# Patient Record
Sex: Male | Born: 1960 | Race: White | Hispanic: No | Marital: Single | Smoking: Current every day smoker
Health system: Southern US, Community
[De-identification: ages and names within clinical notes are randomized; demographics above are authoritative.]

## PROBLEM LIST (undated history)

## (undated) DIAGNOSIS — Z87442 Personal history of urinary calculi: Secondary | ICD-10-CM

## (undated) DIAGNOSIS — F32A Depression, unspecified: Secondary | ICD-10-CM

## (undated) DIAGNOSIS — F419 Anxiety disorder, unspecified: Secondary | ICD-10-CM

## (undated) DIAGNOSIS — K219 Gastro-esophageal reflux disease without esophagitis: Secondary | ICD-10-CM

## (undated) DIAGNOSIS — F329 Major depressive disorder, single episode, unspecified: Secondary | ICD-10-CM

## (undated) HISTORY — PX: LITHOTRIPSY: SUR834

## (undated) HISTORY — PX: HIP SURGERY: SHX245

---

## 2007-08-17 ENCOUNTER — Encounter: Admission: RE | Admit: 2007-08-17 | Discharge: 2007-08-17 | Payer: Self-pay | Admitting: Internal Medicine

## 2011-07-23 ENCOUNTER — Other Ambulatory Visit: Payer: Self-pay | Admitting: Internal Medicine

## 2011-07-23 DIAGNOSIS — R911 Solitary pulmonary nodule: Secondary | ICD-10-CM

## 2011-07-27 ENCOUNTER — Ambulatory Visit
Admission: RE | Admit: 2011-07-27 | Discharge: 2011-07-27 | Disposition: A | Discharge status: Home / Self Care | Payer: BC Managed Care – PPO | Source: Ambulatory Visit | Attending: Internal Medicine | Admitting: Internal Medicine

## 2011-07-27 DIAGNOSIS — R911 Solitary pulmonary nodule: Secondary | ICD-10-CM

## 2011-07-27 MED ORDER — IOHEXOL 300 MG/ML  SOLN
75.0000 mL | Freq: Once | INTRAMUSCULAR | Status: AC | PRN
  Administered 2011-07-27: 75 mL via INTRAVENOUS

## 2011-08-04 ENCOUNTER — Other Ambulatory Visit: Payer: Self-pay | Admitting: Internal Medicine

## 2011-08-04 DIAGNOSIS — R911 Solitary pulmonary nodule: Secondary | ICD-10-CM

## 2011-11-27 ENCOUNTER — Other Ambulatory Visit: Payer: BC Managed Care – PPO

## 2014-06-06 ENCOUNTER — Emergency Department (HOSPITAL_COMMUNITY)
Admission: EM | Admit: 2014-06-06 | Discharge: 2014-06-06 | Disposition: A | Discharge status: Home / Self Care | Payer: BLUE CROSS/BLUE SHIELD | Attending: Emergency Medicine | Admitting: Emergency Medicine

## 2014-06-06 ENCOUNTER — Emergency Department (HOSPITAL_COMMUNITY): Payer: BLUE CROSS/BLUE SHIELD

## 2014-06-06 ENCOUNTER — Encounter (HOSPITAL_COMMUNITY): Payer: Self-pay | Admitting: Physical Medicine and Rehabilitation

## 2014-06-06 DIAGNOSIS — N200 Calculus of kidney: Secondary | ICD-10-CM | POA: Diagnosis not present

## 2014-06-06 DIAGNOSIS — R0789 Other chest pain: Secondary | ICD-10-CM | POA: Diagnosis not present

## 2014-06-06 DIAGNOSIS — Z79899 Other long term (current) drug therapy: Secondary | ICD-10-CM | POA: Insufficient documentation

## 2014-06-06 DIAGNOSIS — K2970 Gastritis, unspecified, without bleeding: Secondary | ICD-10-CM | POA: Diagnosis not present

## 2014-06-06 DIAGNOSIS — R1010 Upper abdominal pain, unspecified: Secondary | ICD-10-CM | POA: Diagnosis not present

## 2014-06-06 DIAGNOSIS — R079 Chest pain, unspecified: Secondary | ICD-10-CM | POA: Diagnosis present

## 2014-06-06 DIAGNOSIS — Z720 Tobacco use: Secondary | ICD-10-CM | POA: Insufficient documentation

## 2014-06-06 LAB — COMPREHENSIVE METABOLIC PANEL
ALT: 11 U/L (ref 0–53)
AST: 17 U/L (ref 0–37)
Albumin: 3.8 g/dL (ref 3.5–5.2)
Alkaline Phosphatase: 113 U/L (ref 39–117)
Anion gap: 10 (ref 5–15)
BUN: 18 mg/dL (ref 6–23)
CO2: 24 mmol/L (ref 19–32)
Calcium: 9.1 mg/dL (ref 8.4–10.5)
Chloride: 106 mEq/L (ref 96–112)
Creatinine, Ser: 1.29 mg/dL (ref 0.50–1.35)
GFR calc Af Amer: 71 mL/min — ABNORMAL LOW (ref >90)
GFR calc non Af Amer: 61 mL/min — ABNORMAL LOW (ref >90)
Glucose, Bld: 103 mg/dL — ABNORMAL HIGH (ref 70–99)
Potassium: 3.7 mmol/L (ref 3.5–5.1)
Sodium: 140 mmol/L (ref 135–145)
Total Bilirubin: 0.6 mg/dL (ref 0.3–1.2)
Total Protein: 6.3 g/dL (ref 6.0–8.3)

## 2014-06-06 LAB — CBC WITH DIFFERENTIAL/PLATELET
Basophils Absolute: 0 10*3/uL (ref 0.0–0.1)
Basophils Relative: 0 % (ref 0–1)
Eosinophils Absolute: 0.1 10*3/uL (ref 0.0–0.7)
Eosinophils Relative: 1 % (ref 0–5)
HCT: 45.8 % (ref 39.0–52.0)
Hemoglobin: 15.8 g/dL (ref 13.0–17.0)
Lymphocytes Relative: 23 % (ref 12–46)
Lymphs Abs: 2.1 10*3/uL (ref 0.7–4.0)
MCH: 30.2 pg (ref 26.0–34.0)
MCHC: 34.5 g/dL (ref 30.0–36.0)
MCV: 87.4 fL (ref 78.0–100.0)
Monocytes Absolute: 0.8 10*3/uL (ref 0.1–1.0)
Monocytes Relative: 9 % (ref 3–12)
Neutro Abs: 6 10*3/uL (ref 1.7–7.7)
Neutrophils Relative %: 67 % (ref 43–77)
Platelets: 241 10*3/uL (ref 150–400)
RBC: 5.24 MIL/uL (ref 4.22–5.81)
RDW: 13.9 % (ref 11.5–15.5)
WBC: 9.1 10*3/uL (ref 4.0–10.5)

## 2014-06-06 LAB — I-STAT TROPONIN, ED: Troponin i, poc: 0 ng/mL (ref 0.00–0.08)

## 2014-06-06 LAB — LIPASE, BLOOD: Lipase: 30 U/L (ref 11–59)

## 2014-06-06 MED ORDER — OXYCODONE-ACETAMINOPHEN 5-325 MG PO TABS: 2.0000 | ORAL_TABLET | ORAL | Status: DC | PRN

## 2014-06-06 MED ORDER — TAMSULOSIN HCL 0.4 MG PO CAPS: 0.4000 mg | ORAL_CAPSULE | Freq: Two times a day (BID) | ORAL | Status: AC

## 2014-06-06 MED ORDER — PANTOPRAZOLE SODIUM 20 MG PO TBEC: 20.0000 mg | DELAYED_RELEASE_TABLET | Freq: Every day | ORAL | Status: AC

## 2014-06-06 MED ORDER — IOHEXOL 300 MG/ML  SOLN
100.0000 mL | Freq: Once | INTRAMUSCULAR | Status: AC | PRN
  Administered 2014-06-06: 100 mL via INTRAVENOUS

## 2014-06-06 MED ORDER — MORPHINE SULFATE 4 MG/ML IJ SOLN
4.0000 mg | Freq: Once | INTRAMUSCULAR | Status: AC
  Administered 2014-06-06: 4 mg via INTRAVENOUS
  Filled 2014-06-06: qty 1

## 2014-06-06 MED ORDER — FENTANYL CITRATE 0.05 MG/ML IJ SOLN
50.0000 ug | Freq: Once | INTRAMUSCULAR | Status: AC
  Administered 2014-06-06: 50 ug via INTRAVENOUS
  Filled 2014-06-06: qty 2

## 2014-06-06 MED ORDER — IOHEXOL 300 MG/ML  SOLN: 25.0000 mL | INTRAMUSCULAR | Status: DC

## 2014-06-06 NOTE — ED Notes (Signed)
Pt presents to department for evaluation of diffuse chest pain, ongoing x4 days, 9/10 pain upon arrival to ED, increases with movement. Pt is alert and oriented x4.

## 2014-06-06 NOTE — ED Notes (Signed)
Patient transported to CT 

## 2014-06-06 NOTE — Discharge Instructions (Signed)
Chest Pain (Nonspecific) °It is often hard to give a specific diagnosis for the cause of chest pain. There is always a chance that your pain could be related to something serious, such as a heart attack or a blood clot in the lungs. You need to follow up with your health care provider for further evaluation. °CAUSES  °· Heartburn. °· Pneumonia or bronchitis. °· Anxiety or stress. °· Inflammation around your heart (pericarditis) or lung (pleuritis or pleurisy). °· A blood clot in the lung. °· A collapsed lung (pneumothorax). It can develop suddenly on its own (spontaneous pneumothorax) or from trauma to the chest. °· Shingles infection (herpes zoster virus). °The chest wall is composed of bones, muscles, and cartilage. Any of these can be the source of the pain. °· The bones can be bruised by injury. °· The muscles or cartilage can be strained by coughing or overwork. °· The cartilage can be affected by inflammation and become sore (costochondritis). °DIAGNOSIS  °Lab tests or other studies may be needed to find the cause of your pain. Your health care provider may have you take a test called an ambulatory electrocardiogram (ECG). An ECG records your heartbeat patterns over a 24-hour period. You may also have other tests, such as: °· Transthoracic echocardiogram (TTE). During echocardiography, sound waves are used to evaluate how blood flows through your heart. °· Transesophageal echocardiogram (TEE). °· Cardiac monitoring. This allows your health care provider to monitor your heart rate and rhythm in real time. °· Holter monitor. This is a portable device that records your heartbeat and can help diagnose heart arrhythmias. It allows your health care provider to track your heart activity for several days, if needed. °· Stress tests by exercise or by giving medicine that makes the heart beat faster. °TREATMENT  °· Treatment depends on what may be causing your chest pain. Treatment may include: °¨ Acid blockers for  heartburn. °¨ Anti-inflammatory medicine. °¨ Pain medicine for inflammatory conditions. °¨ Antibiotics if an infection is present. °· You may be advised to change lifestyle habits. This includes stopping smoking and avoiding alcohol, caffeine, and chocolate. °· You may be advised to keep your head raised (elevated) when sleeping. This reduces the chance of acid going backward from your stomach into your esophagus. °Most of the time, nonspecific chest pain will improve within 2-3 days with rest and mild pain medicine.  °HOME CARE INSTRUCTIONS  °· If antibiotics were prescribed, take them as directed. Finish them even if you start to feel better. °· For the next few days, avoid physical activities that bring on chest pain. Continue physical activities as directed. °· Do not use any tobacco products, including cigarettes, chewing tobacco, or electronic cigarettes. °· Avoid drinking alcohol. °· Only take medicine as directed by your health care provider. °· Follow your health care provider's suggestions for further testing if your chest pain does not go away. °· Keep any follow-up appointments you made. If you do not go to an appointment, you could develop lasting (chronic) problems with pain. If there is any problem keeping an appointment, call to reschedule. °SEEK MEDICAL CARE IF:  °· Your chest pain does not go away, even after treatment. °· You have a rash with blisters on your chest. °· You have a fever. °SEEK IMMEDIATE MEDICAL CARE IF:  °· You have increased chest pain or pain that spreads to your arm, neck, jaw, back, or abdomen. °· You have shortness of breath. °· You have an increasing cough, or you cough   up blood.  You have severe back or abdominal pain.  You feel nauseous or vomit.  You have severe weakness.  You faint.  You have chills. This is an emergency. Do not wait to see if the pain will go away. Get medical help at once. Call your local emergency services (911 in [place] Do not drive  yourself to the hospital. MAKE SURE YOU:   Understand these instructions.  Will watch your condition.  Will get help right away if you are not doing well or get worse. Document Released: 02/11/2005 Document Revised: 05/09/2013 Document Reviewed: 12/08/2007 [facility] Patient Information 2015 [employer], [place]. This information is not intended to replace advice given to you by your health care provider. Make sure you discuss any questions you have with your health care provider.   You were evaluated in the ED today for your chest pain. There does not appear to be an emergent cause for your symptoms at this time. You were found to have a kidney stone and stomach inflammation. Please take your medications as prescribed. Please follow-up with your primary care and urologist within the next 48 hours or definitive care and management of your symptoms. Return to ED for worsening symptoms.

## 2014-06-06 NOTE — ED Notes (Signed)
Pt returned from CT °

## 2014-06-06 NOTE — ED Provider Notes (Signed)
CSN: [id number]     Arrival date & time 06/06/14  1401 History   First MD Initiated Contact with Patient 06/06/14 1504     Chief Complaint  Patient presents with  . Chest Pain     (Consider location/radiation/quality/duration/timing/severity/associated sxs/prior Treatment) HPI Dillon Gross is a 54 y.o. male who comes in for evaluation of a 4 day history of sharp, intermittent chest pain. He currently rates his pain as an 8/10. The discomfort has been intermittent, lasting minutes at a time. The pain is exacerbated with movement, better with rest and is non-positional. Pain does not radiate. He reports for the previous 2 weeks he has had a "really bad cold" and is not sure whether to attribute his fevers, mild headache, nausea to the cold or his chest discomfort. He also reports regular exercise and does not experience discomfort when he exercises. Denies changes in vision, shortness of breath, cough, hemoptysis, abdominal pain, vomiting, diarrhea or constipation, numbness or weakness, recent travel or surgery. Current 1 pack per day smoker. Denies family history of cardiac events.  History reviewed. No pertinent past medical history. History reviewed. No pertinent past surgical history. No family history on file. History  Substance Use Topics  . Smoking status: Current Every Day Smoker  . Smokeless tobacco: Not on file  . Alcohol Use: No    Review of Systems  All other systems reviewed and are negative. A 10 point review of systems was completed and was negative except for pertinent positives and negatives as mentioned in the history of present illness      Allergies  Review of patient's allergies indicates no known allergies.  Home Medications   Prior to Admission medications   Medication Sig Start Date End Date Taking? Authorizing Provider  aspirin-acetaminophen-caffeine (EXCEDRIN MIGRAINE) [phone] MG per tablet Take 1 tablet by mouth every 6 (six) hours as needed for  headache (pain).   Yes Historical Provider, MD  sertraline (ZOLOFT) 100 MG tablet Take 200 mg by mouth daily. 05/08/14  Yes Historical Provider, MD   BP 148/97 mmHg  Pulse 70  Temp(Src) 97.3 F (36.3 C) (Oral)  Resp 16  SpO2 96% Physical Exam  Constitutional: He is oriented to person, place, and time. He appears well-developed and well-nourished.  HENT:  Head: Normocephalic and atraumatic.  Mouth/Throat: Oropharynx is clear and moist.  Eyes: Conjunctivae are normal. Pupils are equal, round, and reactive to light. Right eye exhibits no discharge. Left eye exhibits no discharge. No scleral icterus.  Neck: Normal range of motion. Neck supple.  Cardiovascular: Normal rate, regular rhythm, normal heart sounds and intact distal pulses.   Pulmonary/Chest: Effort normal and breath sounds normal. No respiratory distress. He has no wheezes. He has no rales.  Sharp, stabbing pain patient experienced is reproduced upon palpation of right and left subcostal space. Tenderness diffusely to left and right intercostal spaces.  Abdominal: Soft. There is no tenderness.  Mild tenderness to palpation over upper right and left rectus abdominis muscles. No epigastric tenderness. No pulsatile masses.  Abdomen otherwise soft, nontender without rebound or guarding. No overt lesions or deformities noted.  Genitourinary:  Right inguinal hernia.  Musculoskeletal: Normal range of motion. He exhibits no edema or tenderness.  Neurological: He is alert and oriented to person, place, and time.  Cranial Nerves II-XII grossly intact  Skin: Skin is warm and dry. No rash noted.  Psychiatric: He has a normal mood and affect.  Nursing note and vitals reviewed.   ED Course  Procedures (  including critical care time) Labs Review Labs Reviewed  COMPREHENSIVE METABOLIC PANEL - Abnormal; Notable for the following:    Glucose, Bld 103 (*)    GFR calc non Af Amer 61 (*)    GFR calc Af Amer 71 (*)    All other components  within normal limits  CBC WITH DIFFERENTIAL  Rosezena Sensor, ED    Imaging Review Dg Chest 2 View  06/06/2014   CLINICAL DATA:  Chest pain for 4 days  EXAM: CHEST  2 VIEW  COMPARISON:  07/27/2011  FINDINGS: Cardiomediastinal silhouette is stable. Mild hyperinflation. Again noted accessory azygos fissure/lobe. Stable 8 mm nodular density in right midlung laterally. No acute infiltrate or pulmonary edema.  IMPRESSION: No acute infiltrate or pulmonary edema. Stable nodular density in right midlung laterally measures about 8 mm.   Electronically Signed   By: Natasha Mead M.D.   On: 06/06/2014 15:02     EKG Interpretation None       Date: 06/07/2014  Rate: 91  Rhythm: normal sinus rhythm  QRS Axis: normal  Intervals: normal  ST/T Wave abnormalities: normal  Conduction Disutrbances:none  Narrative Interpretation:   Old EKG Reviewed: none available     Meds given in ED:  Medications  fentaNYL (SUBLIMAZE) injection 50 mcg (50 mcg Intravenous Given 06/06/14 1537)    New Prescriptions   No medications on file   Filed Vitals:   06/06/14 1459 06/06/14 1526 06/06/14 1539 06/06/14 1600  BP: 156/106 149/97 155/103 148/97  Pulse: 77 78 70 70  Temp:      TempSrc:      Resp: SpO2: 98% 97% 98% 96%    MDM  Vitals stable - WNL -afebrile Pt resting comfortably in ED. Pain managed in ED.  PE--tenderness and discomfort reproducible on exam. Labwork--noncontributory, troponin negative, EKG unremarkable Imaging--CT abdomen reveals 8 mm renal calculus in the left ureter with mild hydronephrosis. Evidence of gastroenteritis.  DDX--low concern for AAA, ACS, PE or pneumothorax. Patient discomfort likely multifactorial with gastritis and nephrolithiasis. Will DC with tamsulosin, pain medicine, PPI. Instructions to follow-up with PCP, referral given for urology. I discussed all relevant lab findings and imaging results with pt and they verbalized understanding. Discussed f/u with  PCP within 48 hrs and return precautions, pt very amenable to plan.  Prior to patient discharge, I discussed and reviewed this case with Dr. Juleen China     Final diagnoses:  Chest pain       Sharlene Motts, PA-C 06/07/14 1134  Raeford Razor, MD 06/08/14 [phone]

## 2014-06-13 ENCOUNTER — Other Ambulatory Visit: Payer: Self-pay | Admitting: Urology

## 2014-06-15 ENCOUNTER — Encounter (HOSPITAL_COMMUNITY): Payer: Self-pay | Admitting: *Deleted

## 2014-06-18 ENCOUNTER — Ambulatory Visit (HOSPITAL_COMMUNITY)
Admission: RE | Admit: 2014-06-18 | Discharge: 2014-06-18 | Disposition: A | Discharge status: Home / Self Care | Payer: BLUE CROSS/BLUE SHIELD | Source: Ambulatory Visit | Attending: Urology | Admitting: Urology

## 2014-06-18 ENCOUNTER — Ambulatory Visit (HOSPITAL_COMMUNITY): Payer: BLUE CROSS/BLUE SHIELD

## 2014-06-18 ENCOUNTER — Encounter (HOSPITAL_COMMUNITY): Payer: Self-pay | Admitting: *Deleted

## 2014-06-18 ENCOUNTER — Encounter (HOSPITAL_COMMUNITY)
Admission: RE | Disposition: A | Discharge status: Home / Self Care | Payer: Self-pay | Source: Ambulatory Visit | Attending: Urology

## 2014-06-18 DIAGNOSIS — Z791 Long term (current) use of non-steroidal anti-inflammatories (NSAID): Secondary | ICD-10-CM | POA: Diagnosis not present

## 2014-06-18 DIAGNOSIS — Z79899 Other long term (current) drug therapy: Secondary | ICD-10-CM | POA: Diagnosis not present

## 2014-06-18 DIAGNOSIS — N201 Calculus of ureter: Secondary | ICD-10-CM | POA: Diagnosis not present

## 2014-06-18 DIAGNOSIS — N202 Calculus of kidney with calculus of ureter: Secondary | ICD-10-CM | POA: Diagnosis present

## 2014-06-18 DIAGNOSIS — F329 Major depressive disorder, single episode, unspecified: Secondary | ICD-10-CM | POA: Diagnosis not present

## 2014-06-18 DIAGNOSIS — K219 Gastro-esophageal reflux disease without esophagitis: Secondary | ICD-10-CM | POA: Diagnosis not present

## 2014-06-18 DIAGNOSIS — F17210 Nicotine dependence, cigarettes, uncomplicated: Secondary | ICD-10-CM | POA: Diagnosis not present

## 2014-06-18 HISTORY — DX: Major depressive disorder, single episode, unspecified: F32.9

## 2014-06-18 HISTORY — DX: Anxiety disorder, unspecified: F41.9

## 2014-06-18 HISTORY — DX: Personal history of urinary calculi: Z87.442

## 2014-06-18 HISTORY — DX: Gastro-esophageal reflux disease without esophagitis: K21.9

## 2014-06-18 HISTORY — DX: Depression, unspecified: F32.A

## 2014-06-18 SURGERY — LITHOTRIPSY, ESWL
Anesthesia: LOCAL | Laterality: Left

## 2014-06-18 MED ORDER — DIPHENHYDRAMINE HCL 25 MG PO CAPS
25.0000 mg | ORAL_CAPSULE | ORAL | Status: AC
  Administered 2014-06-18: 25 mg via ORAL
  Filled 2014-06-18: qty 1

## 2014-06-18 MED ORDER — DIAZEPAM 5 MG PO TABS
10.0000 mg | ORAL_TABLET | ORAL | Status: AC
  Administered 2014-06-18: 10 mg via ORAL
  Filled 2014-06-18: qty 2

## 2014-06-18 MED ORDER — TAMSULOSIN HCL 0.4 MG PO CAPS: 0.4000 mg | ORAL_CAPSULE | ORAL | Status: AC

## 2014-06-18 MED ORDER — OXYCODONE HCL 10 MG PO TABS: 10.0000 mg | ORAL_TABLET | ORAL | Status: AC | PRN

## 2014-06-18 MED ORDER — CIPROFLOXACIN HCL 500 MG PO TABS
500.0000 mg | ORAL_TABLET | ORAL | Status: AC
  Administered 2014-06-18: 500 mg via ORAL
  Filled 2014-06-18: qty 1

## 2014-06-18 MED ORDER — SODIUM CHLORIDE 0.9 % IV SOLN
INTRAVENOUS | Status: DC
  Administered 2014-06-18: 10:00:00 via INTRAVENOUS

## 2014-06-18 NOTE — Discharge Instructions (Signed)

## 2014-06-18 NOTE — H&P (Signed)
[name] is a 54 year old male with a left ureteral stone.   History of Present Illness      Left nephrolithiasis: Several small, nonobstructing left renal calculi were identified within the left kidney on CT scan done in 2/13 with no right renal calculi identified.      Microscopic hematuria: He was noted to have microscopic hematuria initially in 4/12 and was seen by Dr. Aldean Gross who recommended further evaluation with CT scan however the patient failed to return.     He was told about 20 years ago that he had "interstitial cystitis" and treated with DMSO by Dr. Dannette Gross for about a month but was fine for about 20 years. He also saw Dr. Zettie Gross about that time for IBS which also has been quite recently. He does continue to smoke a pack a day. He has microscopic hematuria but has not seen gross blood. Normally can go 2-3 hours between voids but now has to go less than one hour.     Interval history: A CT scan done on 06/06/14 revealed bilateral renal cysts as well as 2-3 stones in the left kidney that were all small and nonobstructing. There also appeared to be an 8 mm stone in his left ureter at the level of the L4 vertebral body that possibly represented a conglomeration of stones. It had Hounsfield units of 1000. He has had some intermittent discomfort. He's not had any hematuria, nausea, vomiting or fever.  His stone pain was moderate to severe and was not relieved by positional change. There are no modifying factors or associated signs and symptoms of that as above.   Past Medical History Problems  1. History of Anxiety (F41.9) 2. History of depression (Z86.59) 3. History of esophageal reflux (Z87.19) 4. History of Inguinal hernia, right (K40.90)  Surgical History Problems  1. History of Hip Surgery  Current Meds 1. Advil TABS;  Therapy: (Recorded:04Apr2012) to Recorded 2. Amitriptyline HCl - 25 MG Oral Tablet; TAKE 1 TABLET AT BEDTIME;  Therapy: 13Feb2013 to  (Evaluate:14May2013)  Requested for: 13Feb2013; Last  Rx:13Feb2013 Ordered 3. Ciprofloxacin HCl - 500 MG Oral Tablet; TAKE 1 TABLET Every twelve hours;  Therapy: 13Feb2013 to (Evaluate:13Mar2013)  Requested for: 13Feb2013; Last  Rx:13Feb2013 Ordered 4. Excedrin Migraine TABS;  Therapy: (Recorded:26Jan2016) to Recorded 5. Sertraline HCl - 100 MG Oral Tablet;  Therapy: 23Sep2011 to Recorded 6. Uribel 118 MG Oral Capsule; TAKE 1 CAPSULE Every 6 hours prn bladder pain,  frequency, urgency;  Therapy: 13Feb2013 to (Last Rx:13Feb2013) Ordered  Allergies Medication  1. No Known Drug Allergies  Family History Problems  1. Family history of Congestive Heart Failure : Father 2. Family history of Family Health Status - Mother's Age   age 20 3. Family history of Father Deceased At Age ____   age 69 4. Family history of Lung Cancer : Mother 5. Family history of No Significant Family History  Social History Problems  1. Alcohol Use 2. Caffeine Use 3. Former smoker [phone])   1ppd for 30 years now. 4. Marital History - Currently Married 5. Occupation:   Airline pilot 6. Denied: History of Tobacco Use  Review of Systems Genitourinary, constitutional, skin, eye, otolaryngeal, hematologic/lymphatic, cardiovascular, pulmonary, endocrine, musculoskeletal, gastrointestinal, neurological and psychiatric system(s) were reviewed and pertinent findings if present are noted.  Genitourinary: urinary frequency, feelings of urinary urgency, nocturia, weak urinary stream, urinary stream starts and stops, pelvic pain and suprapubic pain, but no dysuria.  Gastrointestinal: heartburn (History of IBS 20 years ago).  Musculoskeletal: (Broken  hip with plate that was later removed in 1988/1989).   Physical Exam Constitutional: Well nourished and well developed. No acute distress. Anxious, young white male.  ENT:. The ears and nose are normal in appearance.  Neck: The appearance of the neck is normal and no  neck mass is present.  Pulmonary: No respiratory distress and normal respiratory rhythm and effort.  Cardiovascular: Heart rate and rhythm are normal. No peripheral edema.  Abdomen: The abdomen is flat. The abdomen is soft and nontender. No masses are palpated. No CVA tenderness.  A right inguinal hernia is present, which is reducible. No hepatosplenomegaly noted.  Rectal: Rectal exam demonstrates normal sphincter tone, no tenderness and no masses. Estimated prostate size is 1+. Normal rectal tone, no rectal masses, prostate is smooth, symmetric and non-tender. The prostate has no nodularity and is tender. The left seminal vesicle is nonpalpable. The right seminal vesicle is nonpalpable. The perineum is normal on inspection.  Genitourinary: Examination of the penis demonstrates no discharge, no masses, no lesions and a normal meatus. The penis is circumcised. The scrotum is without lesions. The right epididymis is palpably normal and non-tender. The left epididymis is palpably normal and non-tender. The right testis is non-tender and without masses. The left testis is non-tender and without masses.  Lymphatics: The femoral and inguinal nodes are not enlarged or tender.  Skin: Normal skin turgor, no visible rash and no visible skin lesions.  Neuro/Psych:. Mood and affect are appropriate.     Vitals Vital Signs  Blood Pressure: 143 / 92 Heart Rate: 83 Recorded: 26Jan2016 10:35AM  Blood Pressure: 150 / 102, LUE, Sitting  Results/Data Urine    26Jan2016 COLOR YELLOW  APPEARANCE CLEAR  SPECIFIC GRAVITY 1.025  pH 6.0  GLUCOSE NEG mg/dL BILIRUBIN NEG  KETONE NEG mg/dL BLOOD TRACE  PROTEIN NEG mg/dL UROBILINOGEN 0.2 mg/dL NITRITE NEG  LEUKOCYTE ESTERASE NEG  SQUAMOUS EPITHELIAL/HPF RARE  WBC NONE SEEN WBC/hpf RBC 3-6 RBC/hpf BACTERIA NONE SEEN  CRYSTALS NONE SEEN  CASTS NONE SEEN   KUB: Calcifications are noted in the area of the ureter but somewhat medial to its typical position.  They appear to be a conglomeration of stones at the L4 vertebral body level. The lower pole stone in his left kidney is noted as well.  UA: Red cells were noted but his urine was otherwise negative. I was surprised yesterday.    Assessment   We discussed the management of urinary stones. These options include observation, ureteroscopy, shockwave lithotripsy, and PCNL. We discussed which options are relevant to these particular stones. We discussed the natural history of stones as well as the complications of untreated stones and the impact on quality of life without treatment as well as with each of the above listed treatments. We also discussed the efficacy of each treatment in its ability to clear the stone burden. With any of these management options I discussed the signs and symptoms of infection and the need for emergent treatment should these be experienced. For each option we discussed the ability of each procedure to clear the patient of their stone burden.    For observation I described the risks which include but are not limited to silent renal damage, life-threatening infection, need for emergent surgery, failure to pass stone, and pain.    For ureteroscopy I described the risks which include heart attack, stroke, pulmonary embolus, death, bleeding, infection, damage to contiguous structures, positioning injury, ureteral stricture, ureteral avulsion, ureteral injury, need for ureteral stent, inability to  perform ureteroscopy, need for an interval procedure, inability to clear stone burden, stent discomfort and pain.    For shockwave lithotripsy I described the risks which include arrhythmia, kidney contusion, kidney hemorrhage, need for transfusion, long-term risk of diabetes or hypertension, back discomfort, flank ecchymosis, flank abrasion, inability to break up stone, inability to pass stone fragments, Steinstrasse, infection associated with obstructing stones, need for different  surgical procedure, need for repeat shockwave lithotripsy, and death.    After discussing each of these options he has elected to proceed with lithotripsy. Because he still having pain I am going to give him pain medication.   Plan  He'll be scheduled for lithotripsy of his left mid ureteral stone.

## 2014-06-18 NOTE — Op Note (Signed)
See [facility] OP note scanned into chart. Also because of the size, density, location and other factors that cannot be anticipated I feel this will likely be a staged procedure. This fact supersedes any indication in the scanned [facility] operative note to the contrary.  

## 2014-08-20 ENCOUNTER — Encounter (HOSPITAL_COMMUNITY): Payer: Self-pay

## 2014-08-20 ENCOUNTER — Emergency Department (HOSPITAL_COMMUNITY): Payer: BLUE CROSS/BLUE SHIELD

## 2014-08-20 ENCOUNTER — Emergency Department (HOSPITAL_COMMUNITY)
Admission: EM | Admit: 2014-08-20 | Discharge: 2014-08-20 | Disposition: A | Discharge status: Home / Self Care | Payer: BLUE CROSS/BLUE SHIELD | Attending: Emergency Medicine | Admitting: Emergency Medicine

## 2014-08-20 DIAGNOSIS — R109 Unspecified abdominal pain: Secondary | ICD-10-CM | POA: Insufficient documentation

## 2014-08-20 DIAGNOSIS — F329 Major depressive disorder, single episode, unspecified: Secondary | ICD-10-CM | POA: Insufficient documentation

## 2014-08-20 DIAGNOSIS — R079 Chest pain, unspecified: Secondary | ICD-10-CM | POA: Diagnosis present

## 2014-08-20 DIAGNOSIS — Z720 Tobacco use: Secondary | ICD-10-CM | POA: Diagnosis not present

## 2014-08-20 DIAGNOSIS — K219 Gastro-esophageal reflux disease without esophagitis: Secondary | ICD-10-CM | POA: Insufficient documentation

## 2014-08-20 DIAGNOSIS — R0789 Other chest pain: Secondary | ICD-10-CM | POA: Diagnosis not present

## 2014-08-20 DIAGNOSIS — F419 Anxiety disorder, unspecified: Secondary | ICD-10-CM | POA: Diagnosis not present

## 2014-08-20 DIAGNOSIS — Z87442 Personal history of urinary calculi: Secondary | ICD-10-CM | POA: Insufficient documentation

## 2014-08-20 DIAGNOSIS — Z79899 Other long term (current) drug therapy: Secondary | ICD-10-CM | POA: Insufficient documentation

## 2014-08-20 LAB — I-STAT TROPONIN, ED: TROPONIN I, POC: 0.01 ng/mL (ref 0.00–0.08)

## 2014-08-20 LAB — COMPREHENSIVE METABOLIC PANEL
ALBUMIN: 4.3 g/dL (ref 3.5–5.2)
ALK PHOS: 111 U/L (ref 39–117)
ALT: 12 U/L (ref 0–53)
AST: 17 U/L (ref 0–37)
Anion gap: 11 (ref 5–15)
BUN: 14 mg/dL (ref 6–23)
CHLORIDE: 103 mmol/L (ref 96–112)
CO2: 25 mmol/L (ref 19–32)
Calcium: 9.4 mg/dL (ref 8.4–10.5)
Creatinine, Ser: 1.15 mg/dL (ref 0.50–1.35)
GFR calc Af Amer: 82 mL/min — ABNORMAL LOW (ref >90)
GFR calc non Af Amer: 70 mL/min — ABNORMAL LOW (ref >90)
Glucose, Bld: 131 mg/dL — ABNORMAL HIGH (ref 70–99)
Potassium: 3.4 mmol/L — ABNORMAL LOW (ref 3.5–5.1)
Sodium: 139 mmol/L (ref 135–145)
TOTAL PROTEIN: 6.8 g/dL (ref 6.0–8.3)
Total Bilirubin: 0.5 mg/dL (ref 0.3–1.2)

## 2014-08-20 LAB — URINALYSIS, ROUTINE W REFLEX MICROSCOPIC
Bilirubin Urine: NEGATIVE
GLUCOSE, UA: NEGATIVE mg/dL
HGB URINE DIPSTICK: NEGATIVE
Ketones, ur: NEGATIVE mg/dL
Leukocytes, UA: NEGATIVE
NITRITE: NEGATIVE
PROTEIN: NEGATIVE mg/dL
Specific Gravity, Urine: 1.036 — ABNORMAL HIGH (ref 1.005–1.030)
UROBILINOGEN UA: 0.2 mg/dL (ref 0.0–1.0)
pH: 5.5 (ref 5.0–8.0)

## 2014-08-20 LAB — CBC WITH DIFFERENTIAL/PLATELET
BASOS PCT: 1 % (ref 0–1)
Basophils Absolute: 0.1 10*3/uL (ref 0.0–0.1)
Eosinophils Absolute: 0.1 10*3/uL (ref 0.0–0.7)
Eosinophils Relative: 1 % (ref 0–5)
HCT: 49.6 % (ref 39.0–52.0)
Hemoglobin: 17.1 g/dL — ABNORMAL HIGH (ref 13.0–17.0)
Lymphocytes Relative: 28 % (ref 12–46)
Lymphs Abs: 2.4 10*3/uL (ref 0.7–4.0)
MCH: 29.9 pg (ref 26.0–34.0)
MCHC: 34.5 g/dL (ref 30.0–36.0)
MCV: 86.9 fL (ref 78.0–100.0)
Monocytes Absolute: 0.8 10*3/uL (ref 0.1–1.0)
Monocytes Relative: 10 % (ref 3–12)
NEUTROS ABS: 5.1 10*3/uL (ref 1.7–7.7)
NEUTROS PCT: 60 % (ref 43–77)
Platelets: 245 10*3/uL (ref 150–400)
RBC: 5.71 MIL/uL (ref 4.22–5.81)
RDW: 13.8 % (ref 11.5–15.5)
WBC: 8.5 10*3/uL (ref 4.0–10.5)

## 2014-08-20 LAB — TROPONIN I: Troponin I: <0.03 ng/mL (ref <0.031)

## 2014-08-20 LAB — LIPASE, BLOOD: Lipase: 23 U/L (ref 11–59)

## 2014-08-20 LAB — D-DIMER, QUANTITATIVE: D-Dimer, Quant: <0.27 ug/mL-FEU (ref 0.00–0.48)

## 2014-08-20 MED ORDER — OXYCODONE-ACETAMINOPHEN 5-325 MG PO TABS
1.0000 | ORAL_TABLET | Freq: Once | ORAL | Status: AC
  Administered 2014-08-20: 1 via ORAL
  Filled 2014-08-20: qty 1

## 2014-08-20 MED ORDER — MORPHINE SULFATE 4 MG/ML IJ SOLN
4.0000 mg | Freq: Once | INTRAMUSCULAR | Status: AC
  Administered 2014-08-20: 4 mg via INTRAVENOUS
  Filled 2014-08-20: qty 1

## 2014-08-20 MED ORDER — ONDANSETRON 4 MG PO TBDP
8.0000 mg | ORAL_TABLET | Freq: Once | ORAL | Status: AC
  Administered 2014-08-20: 8 mg via ORAL
  Filled 2014-08-20: qty 2

## 2014-08-20 MED ORDER — KETOROLAC TROMETHAMINE 30 MG/ML IJ SOLN
30.0000 mg | Freq: Once | INTRAMUSCULAR | Status: AC
  Administered 2014-08-20: 30 mg via INTRAVENOUS
  Filled 2014-08-20: qty 1

## 2014-08-20 MED ORDER — HYDROMORPHONE HCL 1 MG/ML IJ SOLN
1.0000 mg | Freq: Once | INTRAMUSCULAR | Status: AC
  Administered 2014-08-20: 1 mg via INTRAVENOUS
  Filled 2014-08-20: qty 1

## 2014-08-20 MED ORDER — KETOROLAC TROMETHAMINE 30 MG/ML IJ SOLN
30.0000 mg | Freq: Once | INTRAMUSCULAR | Status: AC
  Administered 2014-08-20: 30 mg via INTRAVENOUS
  Filled 2014-08-20 (×2): qty 1

## 2014-08-20 NOTE — Discharge Instructions (Signed)

## 2014-08-20 NOTE — ED Provider Notes (Signed)
CSN: [id number]     Arrival date & time 08/20/14  1118 History   First MD Initiated Contact with Patient 08/20/14 1226     Chief Complaint  Patient presents with  . Chest Pain  . Abdominal Pain     (Consider location/radiation/quality/duration/timing/severity/associated sxs/prior Treatment) Patient is a 54 y.o. male presenting with chest pain.  Chest Pain Pain location:  L chest Pain quality: sharp   Pain radiates to:  Does not radiate Pain radiates to the back: no   Pain severity:  Severe Onset quality:  Gradual Duration:  3 days Timing:  Constant Progression:  Unchanged Chronicity:  Recurrent Context: at rest   Context: not breathing and not lifting   Relieved by:  Nothing Worsened by:  Nothing tried Ineffective treatments:  None tried Associated symptoms: abdominal pain (mild left sided)   Associated symptoms: no back pain, no cough, no fever, no headache, no nausea, no shortness of breath and not vomiting   Risk factors: no coronary artery disease and no diabetes mellitus     Past Medical History  Diagnosis Date  . Depression   . Anxiety   . History of kidney stones   . GERD (gastroesophageal reflux disease)    Past Surgical History  Procedure Laterality Date  . Hip surgery Right 1986ish  . Lithotripsy     No family history on file. History  Substance Use Topics  . Smoking status: Current Every Day Smoker -- 1.00 packs/day    Types: Cigarettes  . Smokeless tobacco: Not on file  . Alcohol Use: No    Review of Systems  Constitutional: Negative for fever and chills.  Eyes: Negative for redness.  Respiratory: Negative for cough and shortness of breath.   Cardiovascular: Positive for chest pain.  Gastrointestinal: Positive for abdominal pain (mild left sided). Negative for nausea, vomiting and diarrhea.  Genitourinary: Negative for dysuria.  Musculoskeletal: Negative for back pain.  Skin: Negative for rash.  Neurological: Negative for headaches.  All  other systems reviewed and are negative.     Allergies  Review of patient's allergies indicates no known allergies.  Home Medications   Prior to Admission medications   Medication Sig Start Date End Date Taking? Authorizing Provider  acetaminophen (TYLENOL) 500 MG tablet Take 1,000 mg by mouth every 6 (six) hours as needed for mild pain.   Yes Historical Provider, MD  aspirin-acetaminophen-caffeine (EXCEDRIN MIGRAINE) [phone] MG per tablet Take 1 tablet by mouth every 6 (six) hours as needed for headache (pain).   Yes Historical Provider, MD  pseudoephedrine (SUDAFED) 30 MG tablet Take 30 mg by mouth every 4 (four) hours as needed for congestion.   Yes Historical Provider, MD  sertraline (ZOLOFT) 100 MG tablet Take 200 mg by mouth every morning.  05/08/14  Yes Historical Provider, MD  Oxycodone HCl 10 MG TABS Take 1 tablet (10 mg total) by mouth every 4 (four) hours as needed. Patient not taking: Reported on 08/20/2014 06/18/14   Ihor Gully, MD  oxyCODONE-acetaminophen (PERCOCET/ROXICET) 5-325 MG per tablet Take 2 tablets by mouth every 4 (four) hours as needed for moderate pain or severe pain. Patient not taking: Reported on 08/20/2014 06/06/14   Joycie Peek, PA-C  pantoprazole (PROTONIX) 20 MG tablet Take 1 tablet (20 mg total) by mouth daily. Patient not taking: Reported on 08/20/2014 06/06/14   Joycie Peek, PA-C  tamsulosin (FLOMAX) 0.4 MG CAPS capsule Take 1 capsule (0.4 mg total) by mouth 2 (two) times daily. Patient not taking: Reported on  08/20/2014 06/06/14   Joycie PeekBenjamin Cartner, PA-C  tamsulosin (FLOMAX) 0.4 MG CAPS capsule Take 1 capsule (0.4 mg total) by mouth daily after supper. Patient not taking: Reported on 08/20/2014 06/18/14   Ihor GullyMark Ottelin, MD   BP 136/101 mmHg  Pulse 86  Temp(Src) 97.9 F (36.6 C) (Oral)  Resp 20  Ht 6' (1.829 m)  Wt 180 lb (81.647 kg)  BMI 24.41 kg/m2  SpO2 100% Physical Exam  Constitutional: He is oriented to person, place, and time. No distress.   HENT:  Head: Normocephalic and atraumatic.  Eyes: EOM are normal. Pupils are equal, round, and reactive to light.  Neck: Normal range of motion. Neck supple.  Cardiovascular: Normal rate.   Pulmonary/Chest: Effort normal. No respiratory distress. He exhibits tenderness (ttp over the left anterior lower ribs.).  Abdominal: Soft. There is no tenderness.  Musculoskeletal: Normal range of motion.  Neurological: He is alert and oriented to person, place, and time.  Skin: No rash noted. He is not diaphoretic.  Psychiatric: He has a normal mood and affect.    ED Course  Procedures (including critical care time) Labs Review Labs Reviewed  CBC WITH DIFFERENTIAL/PLATELET - Abnormal; Notable for the following:    Hemoglobin 17.1 (*)    All other components within normal limits  COMPREHENSIVE METABOLIC PANEL - Abnormal; Notable for the following:    Potassium 3.4 (*)    Glucose, Bld 131 (*)    GFR calc non Af Amer 70 (*)    GFR calc Af Amer 82 (*)    All other components within normal limits  URINALYSIS, ROUTINE W REFLEX MICROSCOPIC - Abnormal; Notable for the following:    Specific Gravity, Urine 1.036 (*)    All other components within normal limits  LIPASE, BLOOD  D-DIMER, QUANTITATIVE  TROPONIN I  I-STAT TROPOININ, ED    Imaging Review Ct Renal Stone Study  08/20/2014   CLINICAL DATA:  Acute left flank pain.  EXAM: CT ABDOMEN AND PELVIS WITHOUT CONTRAST  TECHNIQUE: Multidetector CT imaging of the abdomen and pelvis was performed following the standard protocol without IV contrast.  COMPARISON:  CT scan of June 06, 2014.  FINDINGS: Degenerative disc disease is noted at L3-4 and L4-5. Visualized lung bases appear normal.  Stable hepatic cysts are noted. No other focal abnormality seen in the liver, spleen or pancreas on these unenhanced images. No gallstones are noted. Adrenal glands appear normal. Bilateral nephrolithiasis is noted. No hydronephrosis or renal obstruction is noted.  Atherosclerotic calcifications of abdominal aorta are noted without aneurysm formation. No ureteral calculi are noted. There is no evidence of bowel obstruction. The appendix appears normal. Sigmoid diverticulosis is noted without inflammation. No abnormal fluid collection is noted. Atherosclerotic calcifications of abdominal aorta are noted without aneurysm formation. Urinary bladder appears normal. Stable fat containing right inguinal hernia is noted.  IMPRESSION: Bilateral nephrolithiasis is noted. No hydronephrosis or renal obstruction is noted. Sigmoid diverticulosis is noted without inflammation. Stable fat containing right inguinal hernia.   Electronically Signed   By: Lupita RaiderJames  Green Jr, M.D.   On: 08/20/2014 14:59     EKG Interpretation   Date/Time:  Monday August 20 2014 11:28:25 EDT Ventricular Rate:  92 PR Interval:  130 QRS Duration: 82 QT Interval:  362 QTC Calculation: 447 R Axis:   83 Text Interpretation:  Normal sinus rhythm Left ventricular hypertrophy No  significant change since last tracing Confirmed by Denton LankSTEINL  MD, Caryn BeeKEVIN  ([id number]) on 08/20/2014 3:13:38 PM  MDM   Final diagnoses:  Left sided abdominal pain    53 y/o male with PMH previous kidney stones comes in with left sided chest pain for the past three days which has been constant, sharp, worse with palpation of the area.  The patient says this pain feels similar to a previous left sided stone that he had previously.  On reviewing the medical record it does seem that he came in with complaint of left sided chest pain and was found to have an 8mm stone.    On exam, today, he has exquisite ttp over the left lower anterior ribs.  Lungs CTAB.  VSS, not tachy, not hypoxic.  Patient has had no prev dvt/pe, no leg swelling.  The pain being sharp and constant and focal sounds more like a PE than ACS.  ACS seems to be extremely unlikely with the symptoms he is having.  Also, no hx of CAD, htn, hl, DM.  Patient has had no  falls or trauma to suggest rib frx.  Will obtain cbc/bmp/istat trop/ekg/d-dimer  Dimer negative.  Feel patient is sufficiently low risk that we don't need to pursue CTA PE.  Cbc/bmp WNL.  Have obtained CT abdomen as pt says this pain is similar to previous stones.  No stone seen.  cxr obtained and WNL.  Will plan to obtain trop.  The pain has been constant the past three days and is very atypical.  Given these two things, feel that a single trop for screening is sufficient to make ACS unlikely.  Currently awaiting trop with plan for d/c if negative.  Patient care transferred to Dr Karoline Caldwell, MD 08/20/14 1610  Cathren Laine, MD 08/20/14 [phone]

## 2014-08-20 NOTE — ED Notes (Signed)
Pt. Reports left sided chest pain, denies radiation. States "I was here in January and they found kidney stones". Denies SOB. Reports N/V.  Denies urinary problems, or temperatures. Alert and oriented x4 in triage.

## 2014-08-20 NOTE — ED Provider Notes (Signed)
Assumed care from Dr. Edwyna PerfectProulx and the patient presented for atypical chest pain. Sharp pain over the last few days that had no other associated cardiac symptoms. Low risk for coronary artery disease. EKG reassuring. Plan was for delta troponin.  5:50 PM delta troponin negative. Discharged home with PCP follow-up.  Dorna LeitzAlex Cherlynn Popiel, MD 08/20/14 1751  Tilden FossaElizabeth Rees, MD 08/20/14 [phone]

## 2015-03-26 ENCOUNTER — Emergency Department (HOSPITAL_COMMUNITY)
Admission: EM | Admit: 2015-03-26 | Discharge: 2015-03-26 | Disposition: A | Discharge status: Home / Self Care | Payer: Managed Care, Other (non HMO) | Attending: Emergency Medicine | Admitting: Emergency Medicine

## 2015-03-26 ENCOUNTER — Encounter (HOSPITAL_COMMUNITY): Payer: Self-pay | Admitting: Emergency Medicine

## 2015-03-26 ENCOUNTER — Other Ambulatory Visit: Payer: Self-pay

## 2015-03-26 ENCOUNTER — Emergency Department (HOSPITAL_COMMUNITY): Payer: Managed Care, Other (non HMO)

## 2015-03-26 DIAGNOSIS — Z87442 Personal history of urinary calculi: Secondary | ICD-10-CM | POA: Insufficient documentation

## 2015-03-26 DIAGNOSIS — Z720 Tobacco use: Secondary | ICD-10-CM | POA: Insufficient documentation

## 2015-03-26 DIAGNOSIS — K219 Gastro-esophageal reflux disease without esophagitis: Secondary | ICD-10-CM | POA: Diagnosis not present

## 2015-03-26 DIAGNOSIS — F419 Anxiety disorder, unspecified: Secondary | ICD-10-CM | POA: Diagnosis not present

## 2015-03-26 DIAGNOSIS — R0789 Other chest pain: Secondary | ICD-10-CM | POA: Insufficient documentation

## 2015-03-26 DIAGNOSIS — F329 Major depressive disorder, single episode, unspecified: Secondary | ICD-10-CM | POA: Insufficient documentation

## 2015-03-26 DIAGNOSIS — R079 Chest pain, unspecified: Secondary | ICD-10-CM | POA: Diagnosis present

## 2015-03-26 LAB — BASIC METABOLIC PANEL
ANION GAP: 10 (ref 5–15)
BUN: 19 mg/dL (ref 6–20)
CALCIUM: 8.9 mg/dL (ref 8.9–10.3)
CO2: 21 mmol/L — AB (ref 22–32)
CREATININE: 1.11 mg/dL (ref 0.61–1.24)
Chloride: 107 mmol/L (ref 101–111)
GFR calc non Af Amer: >60 mL/min (ref >60)
Glucose, Bld: 105 mg/dL — ABNORMAL HIGH (ref 65–99)
Potassium: 4 mmol/L (ref 3.5–5.1)
SODIUM: 138 mmol/L (ref 135–145)

## 2015-03-26 LAB — I-STAT TROPONIN, ED
TROPONIN I, POC: 0 ng/mL (ref 0.00–0.08)
TROPONIN I, POC: 0 ng/mL (ref 0.00–0.08)

## 2015-03-26 LAB — CBC
HCT: 48.4 % (ref 39.0–52.0)
HEMOGLOBIN: 16.1 g/dL (ref 13.0–17.0)
MCH: 29.7 pg (ref 26.0–34.0)
MCHC: 33.3 g/dL (ref 30.0–36.0)
MCV: 89.1 fL (ref 78.0–100.0)
PLATELETS: 264 10*3/uL (ref 150–400)
RBC: 5.43 MIL/uL (ref 4.22–5.81)
RDW: 13.9 % (ref 11.5–15.5)
WBC: 10 10*3/uL (ref 4.0–10.5)

## 2015-03-26 LAB — D-DIMER, QUANTITATIVE: D-Dimer, Quant: <0.27 ug/mL-FEU (ref 0.00–0.48)

## 2015-03-26 MED ORDER — INDOMETHACIN 50 MG PO CAPS: 50.0000 mg | ORAL_CAPSULE | Freq: Two times a day (BID) | ORAL | Status: DC

## 2015-03-26 MED ORDER — OXYCODONE-ACETAMINOPHEN 5-325 MG PO TABS
1.0000 | ORAL_TABLET | Freq: Once | ORAL | Status: AC
  Administered 2015-03-26: 1 via ORAL

## 2015-03-26 MED ORDER — OXYCODONE-ACETAMINOPHEN 5-325 MG PO TABS
ORAL_TABLET | ORAL | Status: AC
  Filled 2015-03-26: qty 1

## 2015-03-26 MED ORDER — KETOROLAC TROMETHAMINE 60 MG/2ML IM SOLN
30.0000 mg | Freq: Once | INTRAMUSCULAR | Status: AC
  Administered 2015-03-26: 30 mg via INTRAMUSCULAR
  Filled 2015-03-26: qty 2

## 2015-03-26 NOTE — ED Provider Notes (Signed)
CSN: [id number]     Arrival date & time 03/26/15  1307 History   First MD Initiated Contact with Patient 03/26/15 1644     Chief Complaint  Patient presents with  . Chest Pain     (Consider location/radiation/quality/duration/timing/severity/associated sxs/prior Treatment) HPI Comments: Patient with two week history of left anterior chest discomfort that radiates toward posterior left scapula.  Patient denies fever, chills, cough, shortness of breath, palpitations.  Patient is a 54 y.o. male presenting with chest pain. The history is provided by the patient and medical records. No language interpreter was used.  Chest Pain Pain location:  L chest Pain quality: sharp and throbbing   Pain radiates to:  L shoulder Pain severity:  Severe Onset quality:  Gradual Duration:  2 weeks Timing:  Constant Progression:  Waxing and waning Chronicity:  Recurrent Ineffective treatments: tylenol, advil. Associated symptoms: no abdominal pain, no cough, no nausea, no palpitations, no shortness of breath and not vomiting   Risk factors: male sex     Past Medical History  Diagnosis Date  . Depression   . Anxiety   . History of kidney stones   . GERD (gastroesophageal reflux disease)    Past Surgical History  Procedure Laterality Date  . Hip surgery Right 1986ish  . Lithotripsy     No family history on file. Social History  Substance Use Topics  . Smoking status: Current Every Day Smoker -- 1.00 packs/day    Types: Cigarettes  . Smokeless tobacco: None  . Alcohol Use: Yes    Review of Systems  Respiratory: Negative for cough and shortness of breath.   Cardiovascular: Positive for chest pain. Negative for palpitations.  Gastrointestinal: Negative for nausea, vomiting and abdominal pain.  All other systems reviewed and are negative.     Allergies  Review of patient's allergies indicates no known allergies.  Home Medications   Prior to Admission medications   Medication Sig  Start Date End Date Taking? Authorizing Provider  acetaminophen (TYLENOL) 500 MG tablet Take 1,000 mg by mouth every 6 (six) hours as needed for mild pain.    Historical Provider, MD  aspirin-acetaminophen-caffeine (EXCEDRIN MIGRAINE) [phone] MG per tablet Take 1 tablet by mouth every 6 (six) hours as needed for headache (pain).    Historical Provider, MD  Oxycodone HCl 10 MG TABS Take 1 tablet (10 mg total) by mouth every 4 (four) hours as needed. Patient not taking: Reported on 08/20/2014 06/18/14   Ihor GullyMark Ottelin, MD  oxyCODONE-acetaminophen (PERCOCET/ROXICET) 5-325 MG per tablet Take 2 tablets by mouth every 4 (four) hours as needed for moderate pain or severe pain. Patient not taking: Reported on 08/20/2014 06/06/14   Joycie PeekBenjamin Cartner, PA-C  pantoprazole (PROTONIX) 20 MG tablet Take 1 tablet (20 mg total) by mouth daily. Patient not taking: Reported on 08/20/2014 06/06/14   Joycie PeekBenjamin Cartner, PA-C  pseudoephedrine (SUDAFED) 30 MG tablet Take 30 mg by mouth every 4 (four) hours as needed for congestion.    Historical Provider, MD  sertraline (ZOLOFT) 100 MG tablet Take 200 mg by mouth every morning.  05/08/14   Historical Provider, MD  tamsulosin (FLOMAX) 0.4 MG CAPS capsule Take 1 capsule (0.4 mg total) by mouth 2 (two) times daily. Patient not taking: Reported on 08/20/2014 06/06/14   Joycie PeekBenjamin Cartner, PA-C  tamsulosin (FLOMAX) 0.4 MG CAPS capsule Take 1 capsule (0.4 mg total) by mouth daily after supper. Patient not taking: Reported on 08/20/2014 06/18/14   Ihor GullyMark Ottelin, MD   BP 154/105 mmHg  Pulse 78  Temp(Src) 97.7 F (36.5 C) (Oral)  Resp 18  Ht 6' (1.829 m)  Wt 180 lb (81.647 kg)  BMI 24.41 kg/m2  SpO2 97% Physical Exam  Constitutional: He is oriented to person, place, and time. He appears well-developed and well-nourished.  HENT:  Head: Normocephalic.  Eyes: Conjunctivae are normal.  Neck: Neck supple.  Cardiovascular: Normal rate and regular rhythm.   Pulmonary/Chest: Effort normal and  breath sounds normal. He exhibits tenderness.  Abdominal: Soft. Bowel sounds are normal.  Musculoskeletal: He exhibits no edema or tenderness.  Lymphadenopathy:    He has no cervical adenopathy.  Neurological: He is alert and oriented to person, place, and time.  Skin: Skin is warm and dry.  Psychiatric: He has a normal mood and affect.  Nursing note and vitals reviewed.   ED Course  Procedures (including critical care time) Labs Review Labs Reviewed  BASIC METABOLIC PANEL - Abnormal; Notable for the following:    CO2 21 (*)    Glucose, Bld 105 (*)    All other components within normal limits  CBC  D-DIMER, QUANTITATIVE (NOT AT [facility])  Rosezena Sensor, ED  Rosezena Sensor, ED    Imaging Review Dg Chest 2 View  03/26/2015  CLINICAL DATA:  Patient left chest pain, intermittent, for 1 week. Shortness of breath. EXAM: CHEST  2 VIEW COMPARISON:  Chest radiograph 08/20/2014 FINDINGS: Stable cardiac and mediastinal contours. No large areas of pulmonary consolidation. Two adjacent nodular densities are demonstrated within the peripheral right mid lung, which appear to have increased in size when compared to recent prior examination. Azygos fissure is noted. No pleural effusion or pneumothorax. Mid thoracic spine degenerative changes. IMPRESSION: No acute cardiopulmonary process. Two adjacent nodular densities within the peripheral right mid lung which appear to have increased in size when compared to prior examination, potentially secondary to differences in technique/respiration. Nodular densities were demonstrated in this region on remote prior CT (07/27/2011). Additional follow-up chest CT would allow for definitive comparison and to evaluate for possible changes size. Electronically Signed   By: Annia Belt M.D.   On: 03/26/2015 14:09   I have personally reviewed and evaluated these images and lab results as part of my medical decision-making.   EKG Interpretation None      Patient  presents with recurrent left sided chest wall pain.  Initial episode started about 2 weeks ago, not responding to home treatment.  Pain is reproducible with palpation and worsens with inspiratory effort.  Patient is not tachycardic or tachypneic.  Normal troponin and d-dimer. ECG without ischemic changes. Low suspicion for cardiac or pulmonary etiology. MDM   Final diagnoses:  None    Chest wall pain. Anti-inflammatory. Care instructions provided.  Follow-up with PCP. Return precautions discussed.    Felicie Morn, NP 03/27/15 0105  Felicie Morn, NP 03/27/15 4098  Linwood Dibbles, MD 03/27/15 1058  Linwood Dibbles, MD 03/27/15 1059

## 2015-03-26 NOTE — Discharge Instructions (Signed)

## 2015-03-26 NOTE — ED Notes (Signed)
Patient states chest pain for 2 x weeks that hasn't resolved.  Patient states got worse today with radiation to L back.   Patient denies other symptoms.

## 2015-04-26 ENCOUNTER — Other Ambulatory Visit: Payer: Self-pay | Admitting: Internal Medicine

## 2015-04-26 DIAGNOSIS — R911 Solitary pulmonary nodule: Secondary | ICD-10-CM

## 2015-05-06 ENCOUNTER — Other Ambulatory Visit: Payer: 59

## 2015-05-17 ENCOUNTER — Ambulatory Visit
Admission: RE | Admit: 2015-05-17 | Discharge: 2015-05-17 | Disposition: A | Discharge status: Home / Self Care | Payer: Managed Care, Other (non HMO) | Source: Ambulatory Visit | Attending: Internal Medicine | Admitting: Internal Medicine

## 2015-05-17 DIAGNOSIS — R911 Solitary pulmonary nodule: Secondary | ICD-10-CM

## 2015-05-21 ENCOUNTER — Other Ambulatory Visit: Payer: 59

## 2015-07-05 ENCOUNTER — Other Ambulatory Visit: Payer: Self-pay | Admitting: Internal Medicine

## 2015-07-05 DIAGNOSIS — R079 Chest pain, unspecified: Secondary | ICD-10-CM

## 2015-07-16 ENCOUNTER — Ambulatory Visit
Admission: RE | Admit: 2015-07-16 | Discharge: 2015-07-16 | Disposition: A | Discharge status: Home / Self Care | Payer: Managed Care, Other (non HMO) | Source: Ambulatory Visit | Attending: Internal Medicine | Admitting: Internal Medicine

## 2015-07-16 DIAGNOSIS — R079 Chest pain, unspecified: Secondary | ICD-10-CM

## 2015-07-26 ENCOUNTER — Other Ambulatory Visit: Payer: Self-pay | Admitting: Gastroenterology

## 2015-07-26 ENCOUNTER — Ambulatory Visit
Admission: RE | Admit: 2015-07-26 | Discharge: 2015-07-26 | Disposition: A | Discharge status: Home / Self Care | Payer: Managed Care, Other (non HMO) | Source: Ambulatory Visit | Attending: Internal Medicine | Admitting: Internal Medicine

## 2015-07-26 ENCOUNTER — Other Ambulatory Visit: Payer: Self-pay | Admitting: Internal Medicine

## 2015-07-26 DIAGNOSIS — R634 Abnormal weight loss: Secondary | ICD-10-CM

## 2015-07-26 DIAGNOSIS — R112 Nausea with vomiting, unspecified: Secondary | ICD-10-CM

## 2015-07-26 DIAGNOSIS — R1012 Left upper quadrant pain: Secondary | ICD-10-CM

## 2015-07-26 MED ORDER — IOPAMIDOL (ISOVUE-300) INJECTION 61%
100.0000 mL | Freq: Once | INTRAVENOUS | Status: AC | PRN
  Administered 2015-07-26: 100 mL via INTRAVENOUS

## 2015-07-29 ENCOUNTER — Ambulatory Visit (HOSPITAL_COMMUNITY)
Admission: RE | Admit: 2015-07-29 | Discharge: 2015-07-29 | Disposition: A | Discharge status: Home / Self Care | Payer: Managed Care, Other (non HMO) | Source: Ambulatory Visit | Attending: Gastroenterology | Admitting: Gastroenterology

## 2015-07-29 ENCOUNTER — Encounter (HOSPITAL_COMMUNITY): Payer: Self-pay

## 2015-07-29 ENCOUNTER — Encounter (HOSPITAL_COMMUNITY)
Admission: RE | Disposition: A | Discharge status: Home / Self Care | Payer: Self-pay | Source: Ambulatory Visit | Attending: Gastroenterology

## 2015-07-29 DIAGNOSIS — Z681 Body mass index (BMI) 19 or less, adult: Secondary | ICD-10-CM | POA: Insufficient documentation

## 2015-07-29 DIAGNOSIS — I2510 Atherosclerotic heart disease of native coronary artery without angina pectoris: Secondary | ICD-10-CM | POA: Diagnosis not present

## 2015-07-29 DIAGNOSIS — K7689 Other specified diseases of liver: Secondary | ICD-10-CM | POA: Insufficient documentation

## 2015-07-29 DIAGNOSIS — R634 Abnormal weight loss: Secondary | ICD-10-CM | POA: Insufficient documentation

## 2015-07-29 DIAGNOSIS — K269 Duodenal ulcer, unspecified as acute or chronic, without hemorrhage or perforation: Secondary | ICD-10-CM | POA: Insufficient documentation

## 2015-07-29 DIAGNOSIS — I700 Atherosclerosis of aorta: Secondary | ICD-10-CM | POA: Diagnosis not present

## 2015-07-29 DIAGNOSIS — R1012 Left upper quadrant pain: Secondary | ICD-10-CM | POA: Diagnosis not present

## 2015-07-29 DIAGNOSIS — K259 Gastric ulcer, unspecified as acute or chronic, without hemorrhage or perforation: Secondary | ICD-10-CM | POA: Diagnosis not present

## 2015-07-29 DIAGNOSIS — K2950 Unspecified chronic gastritis without bleeding: Secondary | ICD-10-CM | POA: Diagnosis not present

## 2015-07-29 DIAGNOSIS — R112 Nausea with vomiting, unspecified: Secondary | ICD-10-CM | POA: Diagnosis not present

## 2015-07-29 HISTORY — PX: ESOPHAGOGASTRODUODENOSCOPY: SHX5428

## 2015-07-29 SURGERY — EGD (ESOPHAGOGASTRODUODENOSCOPY)
Anesthesia: Moderate Sedation

## 2015-07-29 MED ORDER — DIPHENHYDRAMINE HCL 50 MG/ML IJ SOLN
INTRAMUSCULAR | Status: AC
  Filled 2015-07-29: qty 1

## 2015-07-29 MED ORDER — LACTATED RINGERS IV SOLN: INTRAVENOUS | Status: DC

## 2015-07-29 MED ORDER — FENTANYL CITRATE (PF) 100 MCG/2ML IJ SOLN
INTRAMUSCULAR | Status: AC
Start: 2015-07-29 — End: 2015-07-29
  Filled 2015-07-29: qty 2

## 2015-07-29 MED ORDER — MIDAZOLAM HCL 5 MG/ML IJ SOLN
INTRAMUSCULAR | Status: AC
  Filled 2015-07-29: qty 2

## 2015-07-29 MED ORDER — FENTANYL CITRATE (PF) 100 MCG/2ML IJ SOLN
INTRAMUSCULAR | Status: DC | PRN
  Administered 2015-07-29 (×3): 25 ug via INTRAVENOUS

## 2015-07-29 MED ORDER — DIPHENHYDRAMINE HCL 50 MG/ML IJ SOLN
INTRAMUSCULAR | Status: DC | PRN
  Administered 2015-07-29: 25 mg via INTRAVENOUS

## 2015-07-29 MED ORDER — MIDAZOLAM HCL 10 MG/2ML IJ SOLN
INTRAMUSCULAR | Status: DC | PRN
  Administered 2015-07-29 (×3): 2 mg via INTRAVENOUS

## 2015-07-29 NOTE — Discharge Instructions (Signed)

## 2015-07-29 NOTE — Op Note (Signed)
[facility] Patient Name: Dillon Gross Procedure Date: 07/29/2015 MRN: [id number] Attending MD: Charolett BumpersMartin K Johnson , MD Date of Birth: 06-16-1960 CSN:  Age: [age] Admit Type: Outpatient Account #: [id number] Procedure:                Upper GI endoscopy Indications:              Epigastric abdominal pain, Abdominal pain in the                            left upper quadrant, Nausea with vomiting, Weight                            loss Providers:                Charolett BumpersMartin K. Johnson, MD, Anthony Saraniel Madden, RN, Harrington ChallengerHope                            Parker, Technician, Clearnce SorrelKatie Smith Referring MD:              Medicines:                Fentanyl 75 micrograms IV, Midazolam 6 mg IV,                            Diphenhydramine 25 mg IV Complications:            No immediate complications. Estimated Blood Loss:     Estimated blood loss: none. Procedure:                Pre-Anesthesia Assessment:                           - Prior to the procedure, a History and Physical                            was performed, and patient medications and                            allergies were reviewed. The patient is competent.                            The risks and benefits of the procedure and the                            sedation options and risks were discussed with the                            patient. All questions were answered and informed                            consent was obtained. Patient identification and                            proposed procedure were verified by the physician  in the pre-procedure area. Mental Status                            Examination: alert and oriented. Airway                            Examination: normal oropharyngeal airway and neck                            mobility. Respiratory Examination: clear to                            auscultation. CV Examination: normal. Prophylactic                            Antibiotics: The patient  does not require                            prophylactic antibiotics. Prior Anticoagulants: The                            patient has taken no previous anticoagulant or                            antiplatelet agents. ASA Grade Assessment: II - A                            patient with mild systemic disease. After reviewing                            the risks and benefits, the patient was deemed in                            satisfactory condition to undergo the procedure.                            The anesthesia plan was to use moderate sedation /                            analgesia (conscious sedation). Immediately prior                            to administration of medications, the patient was                            re-assessed for adequacy to receive sedatives. The                            heart rate, respiratory rate, oxygen saturations,                            blood pressure, adequacy of pulmonary ventilation,  and response to care were monitored throughout the                            procedure. The physical status of the patient was                            re-assessed after the procedure.                           After obtaining informed consent, the endoscope was                            passed under direct vision. Throughout the                            procedure, the patient's blood pressure, pulse, and                            oxygen saturations were monitored continuously. The                            Endoscope was introduced through the mouth, and                            advanced to the second part of duodenum. The upper                            GI endoscopy was accomplished without difficulty.                            The patient tolerated the procedure well. Scope In: Scope Out: Findings:      The oropharynx, larynx and vocal cords were normal.      The Z-line was irregular and was found 40 cm from the incisors.  The       proximal and mid esophagus appeared normal. The distal esophageal mucosa       appeared eroded due to vomiting. I cannot exclude Barrett mucosa in the       distal esophagus.      The examined esophagus was otherwise normal.      One non-bleeding cratered gastric ulcer with no stigmata of bleeding was       found on the lesser curvature of the prepyloric gastric antrum. The       lesion was 30 mm in largest dimension. Biopsies were taken with a cold       forceps for histology. The stomach was otherwise normal.      One non-bleeding cratered duodenal ulcer with no stigmata of bleeding       was found in the duodenal bulb. The lesion was 20 mm in largest       dimension. The descending duodenum was normal.      The exam was otherwise without abnormality. Impression:               - Normal oropharynx.                           -  Z-line irregular, 40 cm from the incisors.                           - Normal distal esophagus was eroded due to                            vomiting. Barrett esophagus cannot be excluded.                           - Non-bleeding gastric ulcer with no stigmata of                            bleeding. Biopsied.                           - One non-bleeding duodenal ulcer with no stigmata                            of bleeding.                           - The examination was otherwise normal. Moderate Sedation:      Moderate (conscious) sedation was administered by the endoscopy nurse       and supervised by the endoscopist. The following parameters were       monitored: oxygen saturation, heart rate, blood pressure, respiratory       rate, EKG, adequacy of pulmonary ventilation, and response to care. Recommendation:           - Patient has a contact number available for                            emergencies. The signs and symptoms of potential                            delayed complications were discussed with the                            patient. Return  to normal activities tomorrow.                            Written discharge instructions were provided to the                            patient.                           - Resume previous diet.                           - Discontinue aspirin and NSAIDs indefinitely. Procedure Code(s):        --- Professional ---                           [phone], Esophagogastroduodenoscopy, flexible,  transoral; with biopsy, single or multiple Diagnosis Code(s):        --- Professional ---                           K25.9, Gastric ulcer, unspecified as acute or                            chronic, without hemorrhage or perforation                           K26.9, Duodenal ulcer, unspecified as acute or                            chronic, without hemorrhage or perforation                           R10.13, Epigastric pain                           R10.12, Left upper quadrant pain                           R11.2, Nausea with vomiting, unspecified                           R63.4, Abnormal weight loss CPT copyright 2016 [employer]. All rights reserved. The codes documented in this report are preliminary and upon coder review may  be revised to meet current compliance requirements. Danise Edge, MD Charolett Bumpers, MD 07/29/2015 2:21:02 PM This report has been signed electronically. Number of Addenda: 0

## 2015-07-29 NOTE — H&P (Signed)
  Problem: Chronic epigastric and left upper quadrant abdominal pain with nausea, vomiting, and unintentional weight loss. 07/26/2015 CT scan of the abdomen and pelvis showed wall thickening of the gastric antrum and proximal duodenum, atherosclerosis of the abdominal aorta without aneurysm, bilateral nonobstructive kidney stones, and stable hepatic cysts.  History: The patient is a 55 year old male born November 06, 1960. He is scheduled to undergo diagnostic esophagogastroduodenoscopy to evaluate epigastric-left upper quadrant abdominal pain associated with nausea, vomiting, and unintentional weight loss. He reports no gastrointestinal bleeding. He has been taking nonsteroidal anti-inflammatory medication. His hemoglobin was normal at 14.2 g and his white blood cell count was normal at 10,100. His complete metabolic profile was normal except for a random glucose 145. Serum lipase was normal. Sedimentation rate was 23 mm per hour.  I discussed with him the complications associated with diagnostic esophagogastroduodenoscopy with biopsies including intestinal bleeding and intestinal perforation.  Past medical history: Major depression. Right inguinal hernia. Kidney stones. Coronary artery calcification on CT scan. Abdominal aortic atherosclerosis by CT scan.  Medication allergies:  Exam: The patient is alert and lying comfortably on the endoscopy stretcher. Abdomen is soft and nontender to palpation. Lungs are clear to auscultation. Cardiac exam reveals a regular rhythm.  Plan: Proceed with diagnostic esophagogastroduodenoscopy with possible gastric and duodenal biopsies.

## 2015-07-31 ENCOUNTER — Encounter (HOSPITAL_COMMUNITY): Payer: Self-pay | Admitting: Gastroenterology

## 2017-05-16 ENCOUNTER — Encounter (HOSPITAL_COMMUNITY): Payer: Self-pay

## 2017-05-16 ENCOUNTER — Other Ambulatory Visit: Payer: Self-pay

## 2017-05-16 ENCOUNTER — Emergency Department (HOSPITAL_COMMUNITY)
Admission: EM | Admit: 2017-05-16 | Discharge: 2017-05-16 | Disposition: A | Discharge status: Home / Self Care | Payer: BLUE CROSS/BLUE SHIELD | Attending: Physician Assistant | Admitting: Physician Assistant

## 2017-05-16 ENCOUNTER — Emergency Department (HOSPITAL_COMMUNITY): Payer: BLUE CROSS/BLUE SHIELD

## 2017-05-16 DIAGNOSIS — F17210 Nicotine dependence, cigarettes, uncomplicated: Secondary | ICD-10-CM | POA: Diagnosis not present

## 2017-05-16 DIAGNOSIS — Z79899 Other long term (current) drug therapy: Secondary | ICD-10-CM | POA: Diagnosis not present

## 2017-05-16 DIAGNOSIS — R69 Illness, unspecified: Secondary | ICD-10-CM | POA: Insufficient documentation

## 2017-05-16 DIAGNOSIS — J111 Influenza due to unidentified influenza virus with other respiratory manifestations: Secondary | ICD-10-CM

## 2017-05-16 DIAGNOSIS — R05 Cough: Secondary | ICD-10-CM | POA: Diagnosis present

## 2017-05-16 MED ORDER — ONDANSETRON 4 MG PO TBDP: 4.0000 mg | ORAL_TABLET | Freq: Three times a day (TID) | ORAL | 0 refills | Status: AC | PRN

## 2017-05-16 MED ORDER — GUAIFENESIN-CODEINE 100-10 MG/5ML PO SOLN: 5.0000 mL | Freq: Four times a day (QID) | ORAL | 0 refills | Status: AC | PRN

## 2017-05-16 NOTE — ED Notes (Signed)
Pt states he did not receive an xray for dx of pneumonia and would like one today

## 2017-05-16 NOTE — ED Notes (Signed)
Patient able to ambulate independently  

## 2017-05-16 NOTE — ED Triage Notes (Signed)
PT reports being dx with PNA last week and treated with zithromax with no improvement. PT states he has still been coughing up green sputum. Endorses generalized body aches and sore throat.

## 2017-05-16 NOTE — ED Provider Notes (Signed)
[facility] Provider Note   CSN: [id number] Arrival date & time: 05/16/17  1553     History   Chief Complaint No chief complaint on file.   HPI Dillon Gross is a 56 y.o. male.  HPI  Healthy-appearing 56 year old male presenting with flulike symptoms.  Patient reports he had symptoms for the last couple days.  He went to his primary care was treated for presumptive pneumonia with Zithromax.  He took this..  However he states he still feels poorly.  We discussed this is likely the flu.  Even though he had a negative flu swab.  Patient has very flulike symptoms.  Classic for this year with congestion, sore throat, cough.  Patient has normal vital signs with a negative chest x-ray.  Past Medical History:  Diagnosis Date  . Anxiety   . Depression   . GERD (gastroesophageal reflux disease)   . History of kidney stones     There are no active problems to display for this patient.   Past Surgical History:  Procedure Laterality Date  . ESOPHAGOGASTRODUODENOSCOPY N/A 07/29/2015   Procedure: ESOPHAGOGASTRODUODENOSCOPY (EGD);  Surgeon: Charolett BumpersMartin K Johnson, MD;  [facility];  Service: Endoscopy;  Laterality: N/A;  With POSSIBLE Balloon Dilation  . HIP SURGERY Right 1986ish  . LITHOTRIPSY         Home Medications    Prior to Admission medications   Medication Sig Start Date End Date Taking? Authorizing Provider  acetaminophen (TYLENOL) 500 MG tablet Take 1,000 mg by mouth every 6 (six) hours as needed for mild pain.    [provider]  guaiFENesin-codeine 100-10 MG/5ML syrup Take 5 mLs by mouth every 6 (six) hours as needed for cough. 05/16/17   Jae Bruck Lyn, MD  ondansetron (ZOFRAN ODT) 4 MG disintegrating tablet Take 1 tablet (4 mg total) by mouth every 8 (eight) hours as needed for nausea or vomiting. 05/16/17   Valjean Ruppel Lyn, MD  Oxycodone HCl 10 MG TABS Take 1 tablet (10 mg total) by mouth every 4  (four) hours as needed. 06/18/14   Ihor Gullyttelin, Mark, MD  pantoprazole (PROTONIX) 20 MG tablet Take 1 tablet (20 mg total) by mouth daily. 06/06/14   Cartner, Sharlet SalinaBenjamin, PA-C  pseudoephedrine (SUDAFED) 30 MG tablet Take 30 mg by mouth every 4 (four) hours as needed for congestion.    [provider]  sertraline (ZOLOFT) 100 MG tablet Take 200 mg by mouth every morning.  05/08/14   [provider]  tamsulosin (FLOMAX) 0.4 MG CAPS capsule Take 1 capsule (0.4 mg total) by mouth 2 (two) times daily. 06/06/14   Cartner, Sharlet SalinaBenjamin, PA-C  tamsulosin (FLOMAX) 0.4 MG CAPS capsule Take 1 capsule (0.4 mg total) by mouth daily after supper. 06/18/14   Ihor Gullyttelin, Mark, MD    Family History No family history on file.  Social History Social History   Tobacco Use  . Smoking status: Current Every Day Smoker    Packs/day: 1.00    Types: Cigarettes  . Smokeless tobacco: Never Used  Substance Use Topics  . Alcohol use: Yes  . Drug use: No     Allergies   Patient has no known allergies.   Review of Systems Review of Systems  Constitutional: Positive for fatigue. Negative for activity change.  HENT: Positive for congestion, sinus pain and sore throat.   Respiratory: Positive for cough. Negative for shortness of breath.   Cardiovascular: Negative for chest pain.  Gastrointestinal: Positive for nausea. Negative for  abdominal pain.  All other systems reviewed and are negative.    Physical Exam Updated Vital Signs BP (!) 147/99 (BP Location: Right Arm)   Pulse 99   Temp 98.9 F (37.2 C) (Oral)   Resp 16   SpO2 95%   Physical Exam  Constitutional: He is oriented to person, place, and time. He appears well-nourished.  HENT:  Head: Normocephalic.  Right Ear: External ear normal.  Left Ear: External ear normal.  Eyes: Conjunctivae are normal. Right eye exhibits no discharge. Left eye exhibits no discharge.  Neck: Normal range of motion. Neck supple.  Cardiovascular: Normal rate and  regular rhythm.  No murmur heard. Pulmonary/Chest: Effort normal and breath sounds normal. No stridor. No respiratory distress.  Neurological: He is oriented to person, place, and time.  Skin: Skin is warm and dry. He is not diaphoretic.  Psychiatric: He has a normal mood and affect. His behavior is normal.     ED Treatments / Results  Labs (all labs ordered are listed, but only abnormal results are displayed) Labs Reviewed - No data to display  EKG  EKG Interpretation None       Radiology Dg Chest 2 View  Result Date: 05/16/2017 CLINICAL DATA:  Productive cough for 1 week. EXAM: CHEST  2 VIEW COMPARISON:  03/26/2015 FINDINGS: The heart size and mediastinal contours are within normal limits. Both lungs are clear. Tiny calcified granulomas in right midlung again seen. Azygos fissure incidentally noted. The visualized skeletal structures are unremarkable. IMPRESSION: Stable exam.  No active cardiopulmonary disease. Electronically Signed   By: Myles RosenthalJohn  Stahl M.D.   On: 05/16/2017 17:32    Procedures Procedures (including critical care time)  Medications Ordered in ED Medications - No data to display   Initial Impression / Assessment and Plan / ED Course  I have reviewed the triage vital signs and the nursing notes.  Pertinent labs & imaging results that were available during my care of the patient were reviewed by me and considered in my medical decision making (see chart for details).      Healthy-appearing 56 year old male presenting with flulike symptoms.  Patient reports he had symptoms for the last couple days.  He went to his primary care was treated for presumptive pneumonia with Zithromax.  He took this..  However he states he still feels poorly.  We discussed this is likely the flu.  Even though he had a negative flu swab.  Patient has very flulike symptoms.  Classic for this year with congestion, sore throat, cough.  Patient has normal vital signs with a negative  chest x-ray.  7:10 PM We discussed supportive care.  We will have him use cough syrup, and Zofran.  With negative x-ray, normal vital signs and reassuring physical exam I think this is likely to be the flu.  We will have him follow-up with the primary care provider next week.  Final Clinical Impressions(s) / ED Diagnoses   Final diagnoses:  Influenza-like illness    ED Discharge Orders        Ordered    guaiFENesin-codeine 100-10 MG/5ML syrup  Every 6 hours PRN     05/16/17 1907    ondansetron (ZOFRAN ODT) 4 MG disintegrating tablet  Every 8 hours PRN     05/16/17 1907       Abelino DerrickMackuen, Idy Rawling Lyn, MD 05/16/17 1911

## 2017-05-16 NOTE — Discharge Instructions (Signed)
You were seen today wit flulike illness.  Please use Tylenol, rest drink plenty of fluids and use the cough syrup and Zofran as needed to help with your symptoms.

## 2018-11-08 IMAGING — DX DG CHEST 2V
2 series · 2 of 2 positions shown · non-contrast
Comparison: 03/26/2015

CLINICAL DATA: Productive cough for 1 week.

EXAM:
CHEST  2 VIEW

[w chest pa]
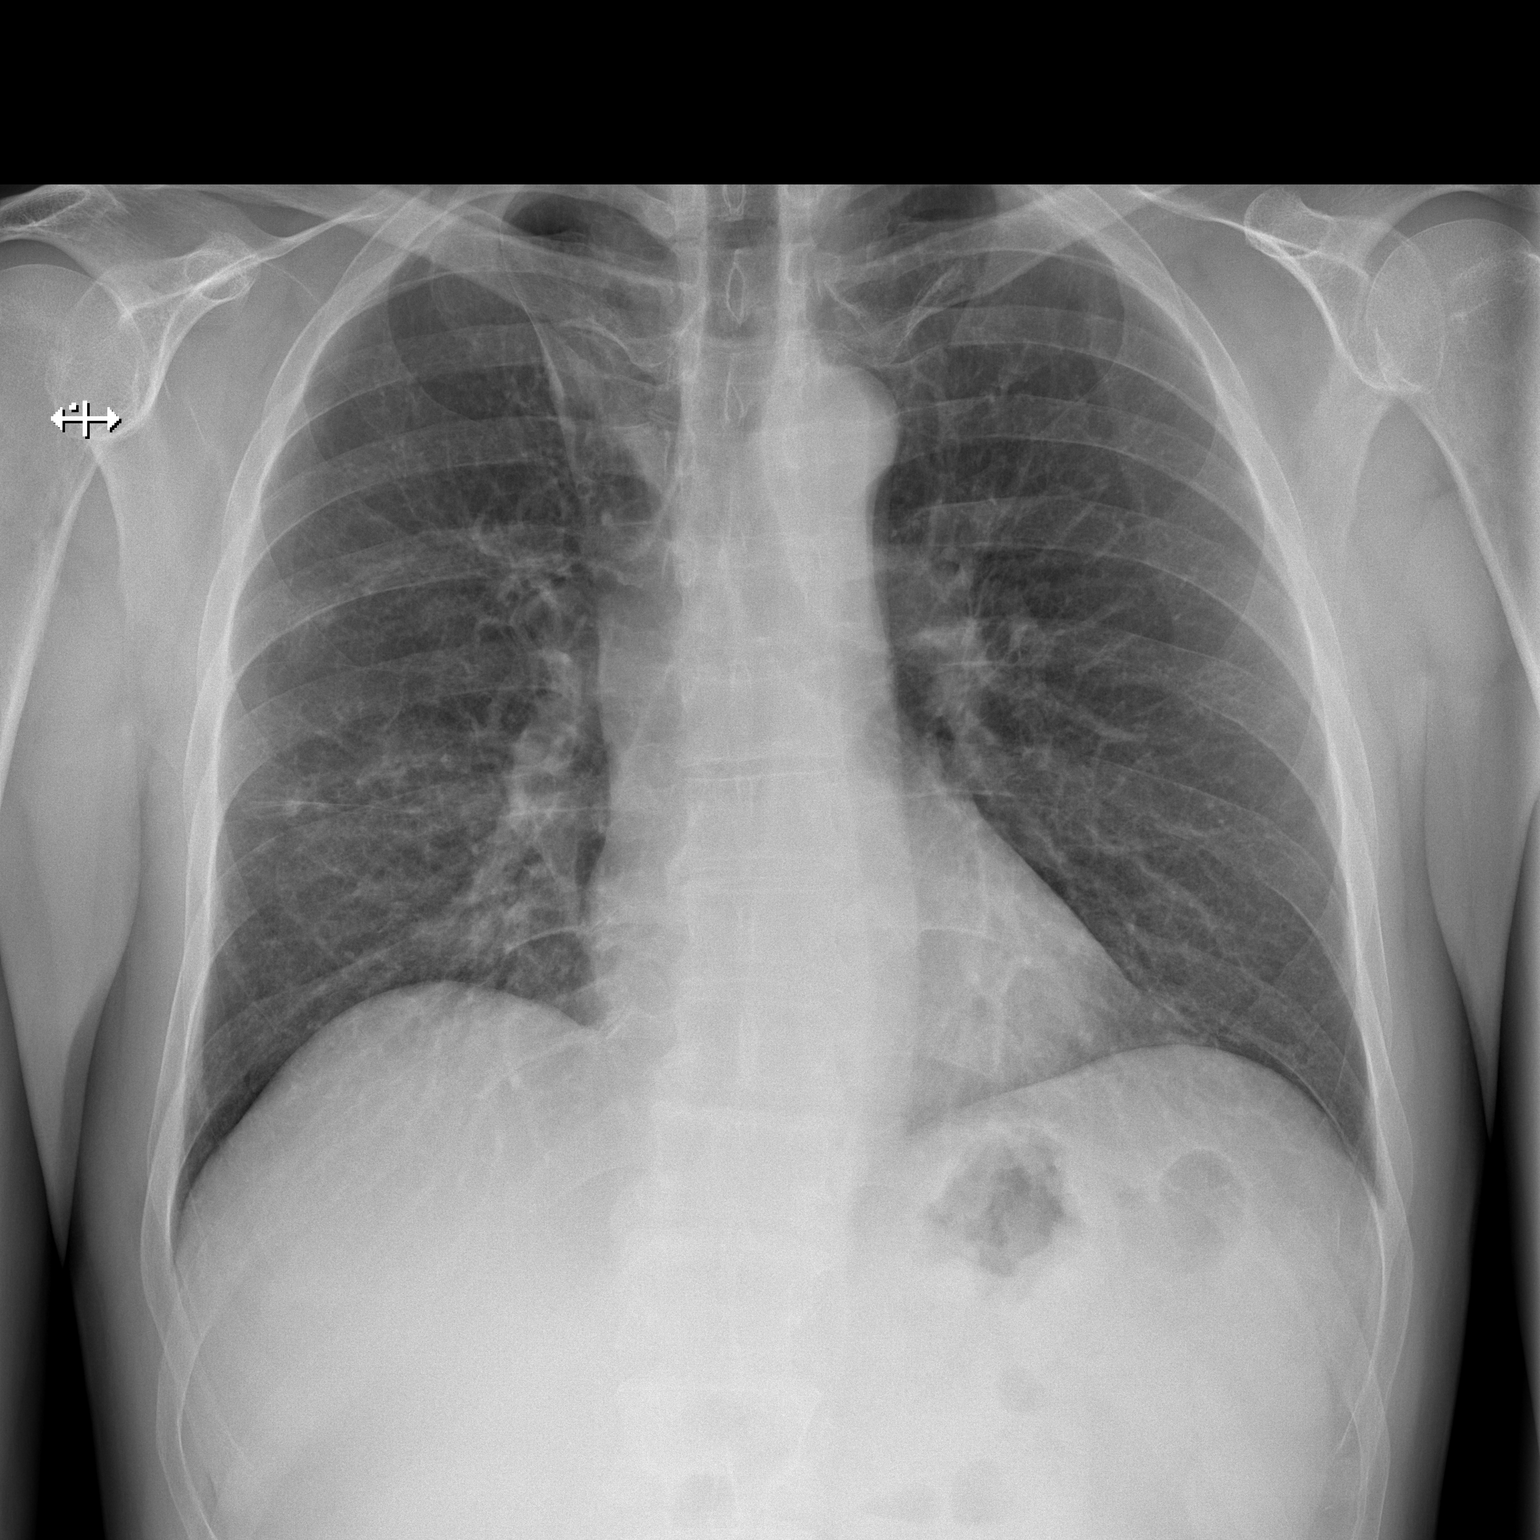

[w chest lat]
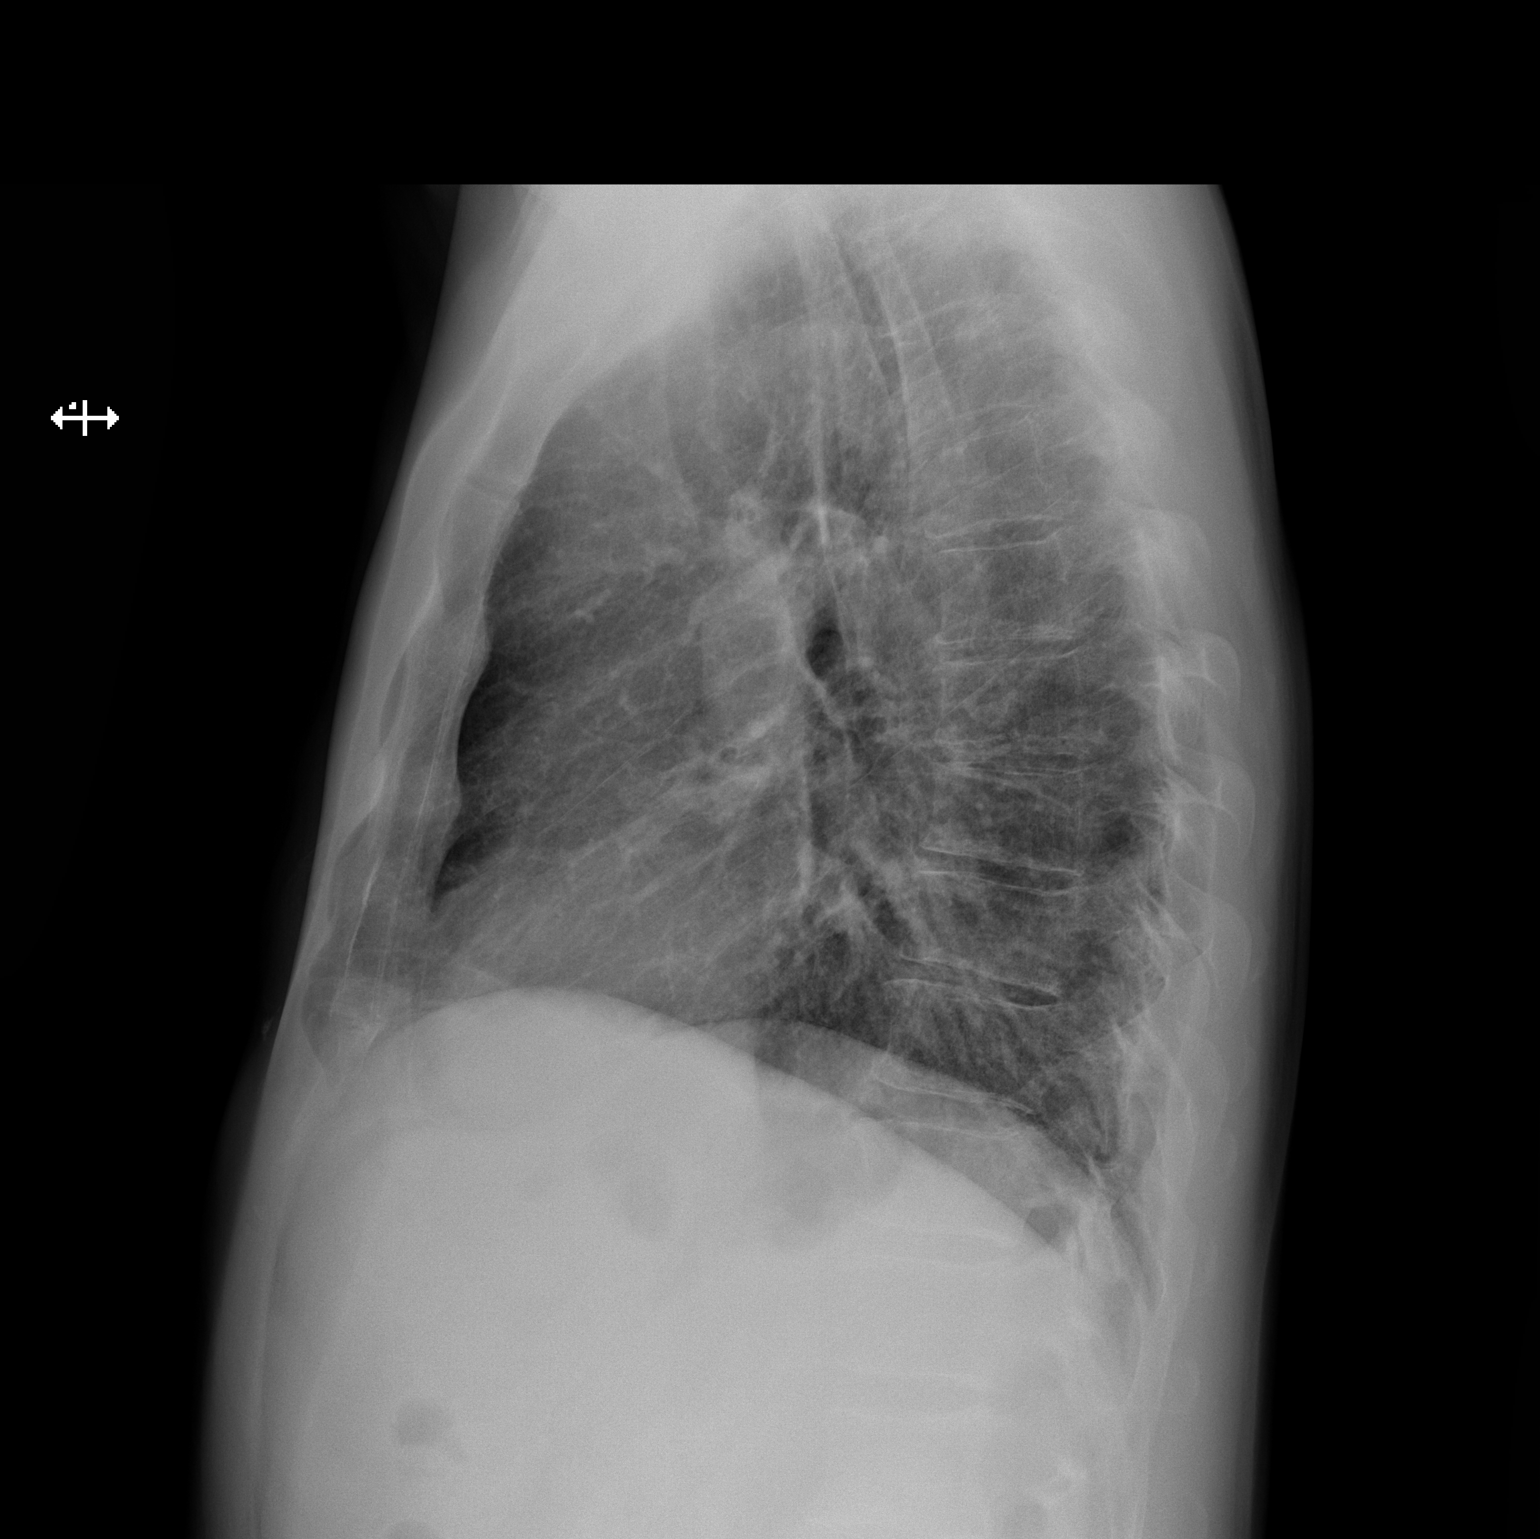

[2 of 2 positions shown; findings below may reference images not displayed]

FINDINGS: The heart size and mediastinal contours are within normal limits.
Both lungs are clear. Tiny calcified granulomas in right midlung
again seen. Azygos fissure incidentally noted. The visualized
skeletal structures are unremarkable.
IMPRESSION: Stable exam.  No active cardiopulmonary disease.
